# Patient Record
Sex: Female | Born: 1955 | Race: White | Hispanic: No | Marital: Single | Smoking: Current every day smoker
Health system: Southern US, Community
[De-identification: ages and names within clinical notes are randomized; demographics above are authoritative.]

## PROBLEM LIST (undated history)

## (undated) DIAGNOSIS — M1990 Unspecified osteoarthritis, unspecified site: Secondary | ICD-10-CM

## (undated) DIAGNOSIS — G479 Sleep disorder, unspecified: Secondary | ICD-10-CM

## (undated) HISTORY — PX: ROTATOR CUFF REPAIR: SHX139

## (undated) HISTORY — PX: FEMUR SURGERY: SHX943

## (undated) HISTORY — PX: MANDIBLE SURGERY: SHX707

## (undated) HISTORY — PX: TONSILLECTOMY: SUR1361

## (undated) HISTORY — PX: KNEE SURGERY: SHX244

---

## 1998-05-27 ENCOUNTER — Ambulatory Visit (HOSPITAL_COMMUNITY): Admission: RE | Admit: 1998-05-27 | Discharge: 1998-05-27 | Payer: Self-pay | Admitting: Obstetrics and Gynecology

## 1999-06-21 ENCOUNTER — Ambulatory Visit (HOSPITAL_COMMUNITY): Admission: RE | Admit: 1999-06-21 | Discharge: 1999-06-21 | Payer: Self-pay | Admitting: *Deleted

## 1999-06-21 ENCOUNTER — Encounter: Payer: Self-pay | Admitting: *Deleted

## 1999-10-16 ENCOUNTER — Encounter: Admission: RE | Admit: 1999-10-16 | Discharge: 1999-10-16 | Payer: Self-pay | Admitting: *Deleted

## 1999-10-16 ENCOUNTER — Encounter: Payer: Self-pay | Admitting: *Deleted

## 1999-10-23 ENCOUNTER — Ambulatory Visit (HOSPITAL_COMMUNITY): Admission: RE | Admit: 1999-10-23 | Discharge: 1999-10-23 | Payer: Self-pay | Admitting: *Deleted

## 1999-10-23 ENCOUNTER — Encounter: Payer: Self-pay | Admitting: *Deleted

## 2001-02-14 ENCOUNTER — Other Ambulatory Visit: Admission: RE | Admit: 2001-02-14 | Discharge: 2001-02-14 | Payer: Self-pay | Admitting: *Deleted

## 2001-02-26 ENCOUNTER — Encounter: Payer: Self-pay | Admitting: Family Medicine

## 2001-02-26 ENCOUNTER — Encounter: Admission: RE | Admit: 2001-02-26 | Discharge: 2001-02-26 | Payer: Self-pay | Admitting: Family Medicine

## 2002-01-15 ENCOUNTER — Encounter: Admission: RE | Admit: 2002-01-15 | Discharge: 2002-01-15 | Payer: Self-pay | Admitting: Family Medicine

## 2002-01-15 ENCOUNTER — Encounter: Payer: Self-pay | Admitting: Family Medicine

## 2002-02-16 ENCOUNTER — Other Ambulatory Visit: Admission: RE | Admit: 2002-02-16 | Discharge: 2002-02-16 | Payer: Self-pay | Admitting: *Deleted

## 2003-01-19 ENCOUNTER — Ambulatory Visit (HOSPITAL_COMMUNITY): Admission: RE | Admit: 2003-01-19 | Discharge: 2003-01-19 | Payer: Self-pay | Admitting: *Deleted

## 2003-01-19 ENCOUNTER — Encounter: Payer: Self-pay | Admitting: *Deleted

## 2003-02-25 ENCOUNTER — Other Ambulatory Visit: Admission: RE | Admit: 2003-02-25 | Discharge: 2003-02-25 | Payer: Self-pay | Admitting: *Deleted

## 2004-04-05 ENCOUNTER — Other Ambulatory Visit: Admission: RE | Admit: 2004-04-05 | Discharge: 2004-04-05 | Payer: Self-pay | Admitting: *Deleted

## 2005-01-12 ENCOUNTER — Ambulatory Visit (HOSPITAL_COMMUNITY): Admission: RE | Admit: 2005-01-12 | Discharge: 2005-01-12 | Payer: Self-pay | Admitting: *Deleted

## 2005-04-11 ENCOUNTER — Other Ambulatory Visit: Admission: RE | Admit: 2005-04-11 | Discharge: 2005-04-11 | Payer: Self-pay | Admitting: *Deleted

## 2005-07-05 ENCOUNTER — Encounter: Admission: RE | Admit: 2005-07-05 | Discharge: 2005-07-05 | Payer: Self-pay | Admitting: Gastroenterology

## 2007-05-27 ENCOUNTER — Ambulatory Visit (HOSPITAL_COMMUNITY): Admission: RE | Admit: 2007-05-27 | Discharge: 2007-05-27 | Payer: Self-pay | Admitting: Obstetrics and Gynecology

## 2007-06-10 ENCOUNTER — Other Ambulatory Visit: Admission: RE | Admit: 2007-06-10 | Discharge: 2007-06-10 | Payer: Self-pay | Admitting: Obstetrics and Gynecology

## 2007-12-24 ENCOUNTER — Encounter: Admission: RE | Admit: 2007-12-24 | Discharge: 2007-12-24 | Payer: Self-pay | Admitting: Family Medicine

## 2009-08-13 IMAGING — CT CT ABDOMEN W/ CM
2 of 5 series · 17 of 46 positions shown, 19 images · IV contrast (READICAT/WATER & [ID] OMNI 300)
Comparison: 07/05/2005.

CT ABDOMEN

CLINICAL DATA: Mid abdominal pain.  Diarrhea and constipation.

CT ABDOMEN AND PELVIS WITH CONTRAST
TECHNIQUE: Multidetector CT imaging of the abdomen and pelvis was
performed using the standard protocol following bolus
administration of intravenous contrast.
Contrast: 100 ml 3mnipaque-UJJ.

[Series 3: routine abdomen · axial · 0.86mm/px · z∈[-377,+13]mm · 14 of 88 slices shown, 16 images]
[im 5/88  soft-tissue]
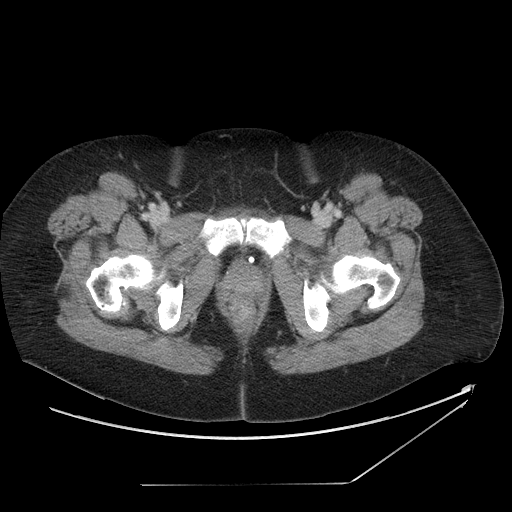
[im 5/88  bone]
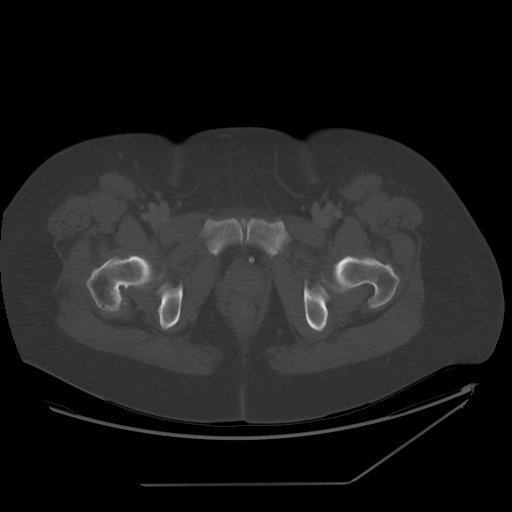
[im 10/88  soft-tissue]
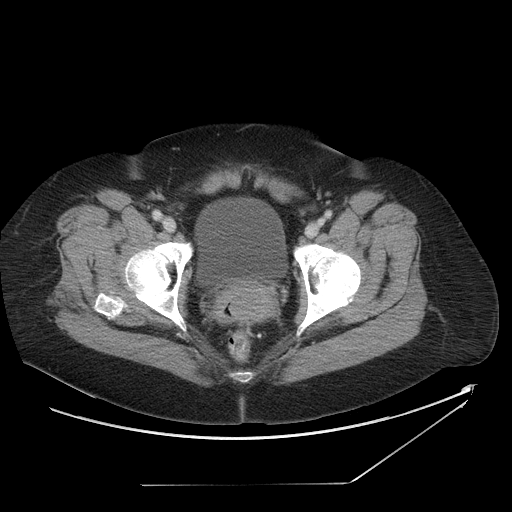
[im 20/88  soft-tissue]
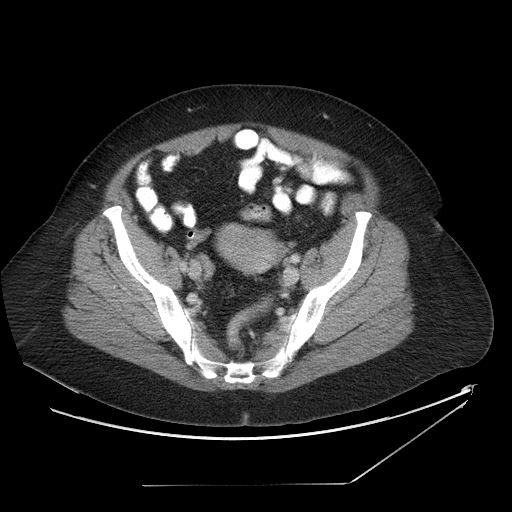
[im 25/88  soft-tissue]
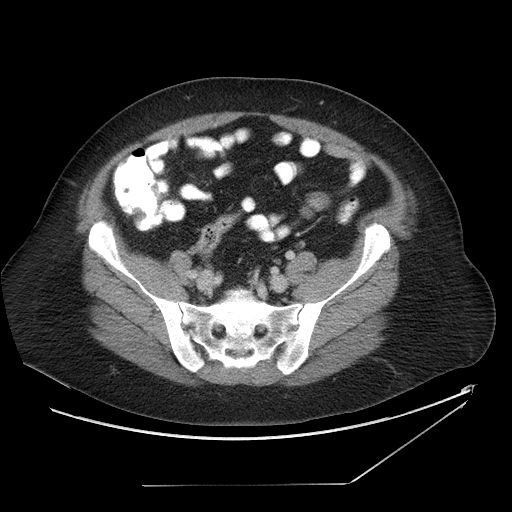
[im 30/88  soft-tissue]
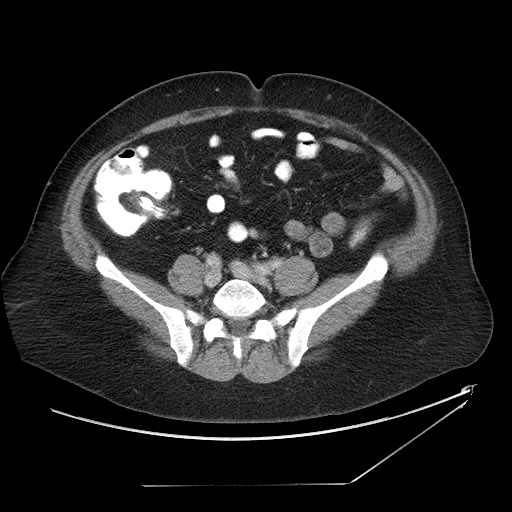
[im 34/88  soft-tissue]
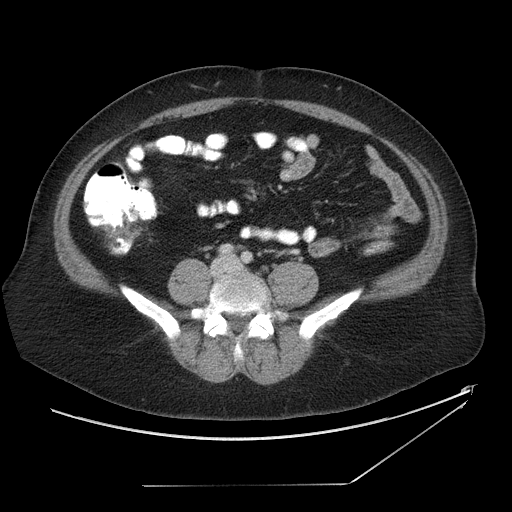
[im 39/88  soft-tissue]
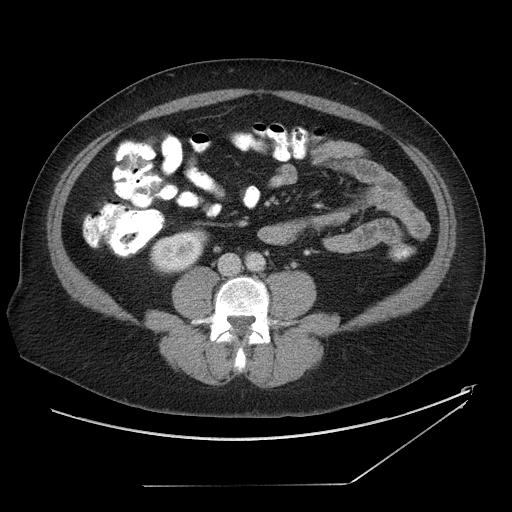
[im 49/88  soft-tissue]
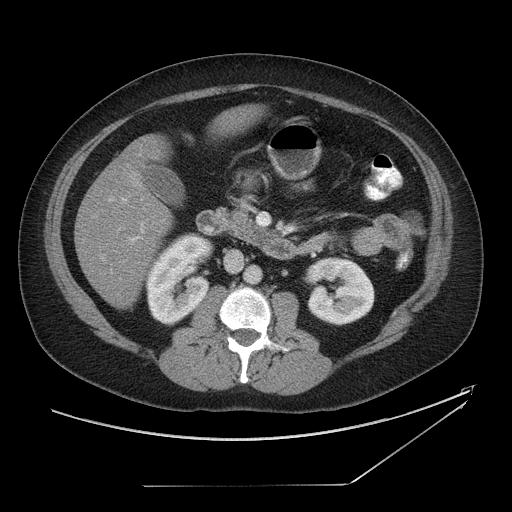
[im 54/88  soft-tissue]
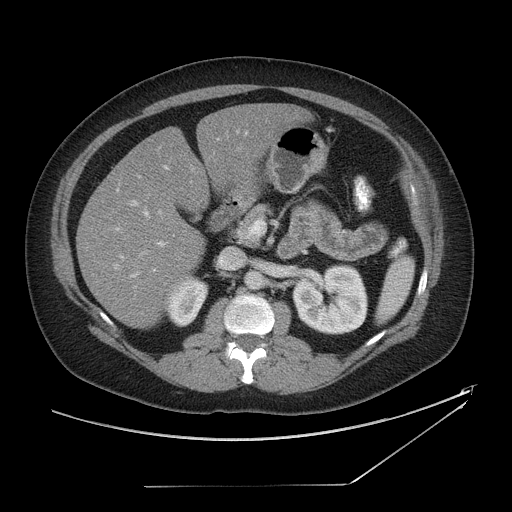
[im 54/88  bone]
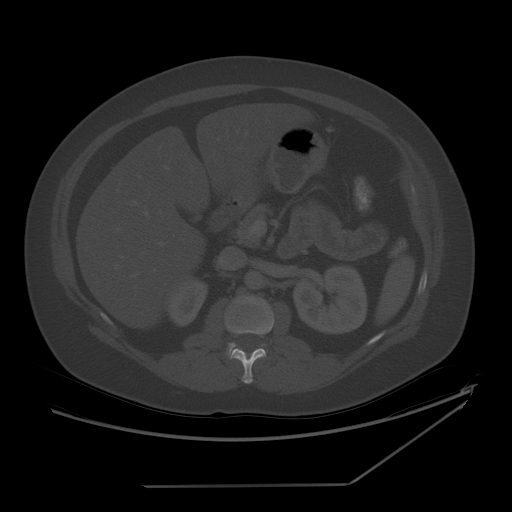
[im 59/88  soft-tissue]
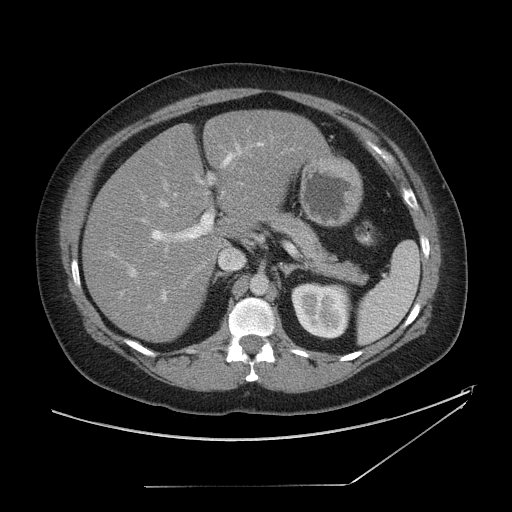
[im 63/88  soft-tissue]
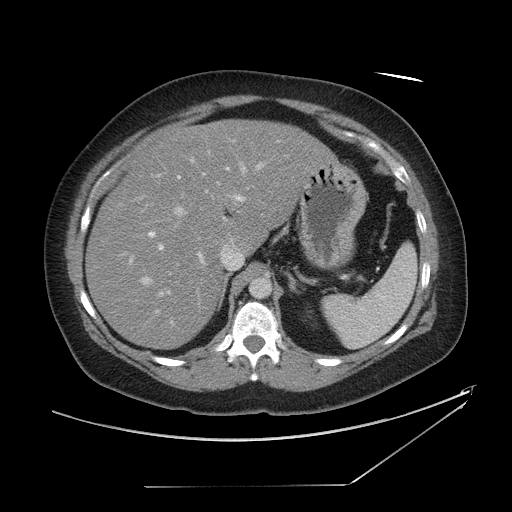
[im 68/88  soft-tissue]
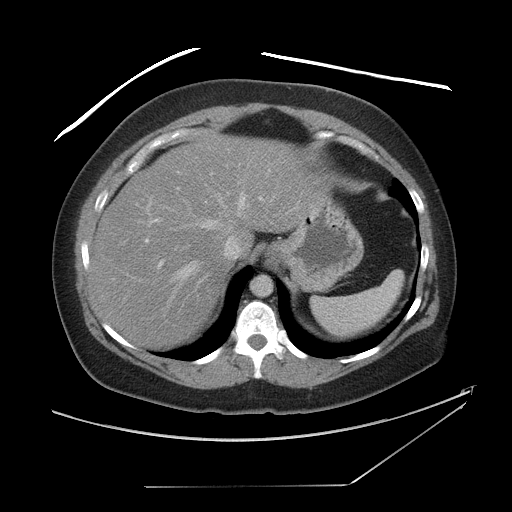
[im 78/88  soft-tissue]
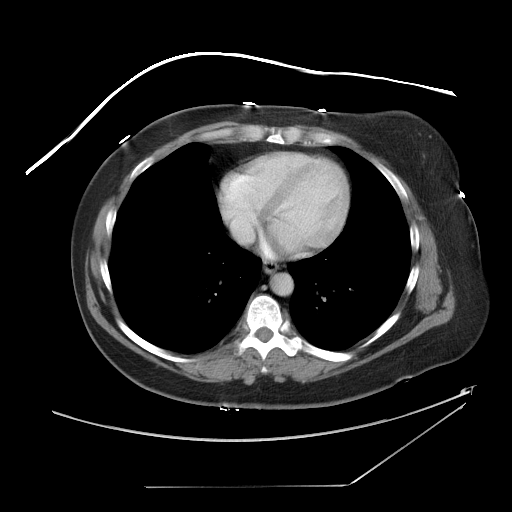
[im 83/88  soft-tissue]
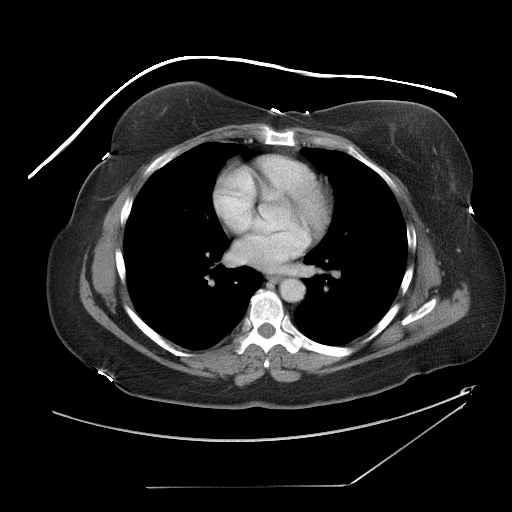

[Series 602: sagittal body · sagittal · 0.91mm/px · 3 of 177 slices shown]
[im 59/177  soft-tissue]
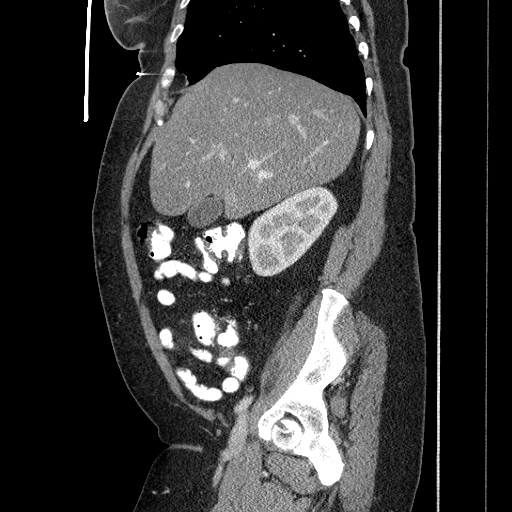
[im 79/177  soft-tissue]
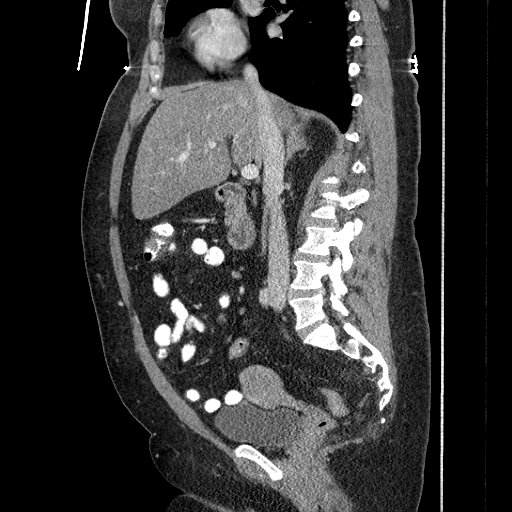
[im 98/177  soft-tissue]
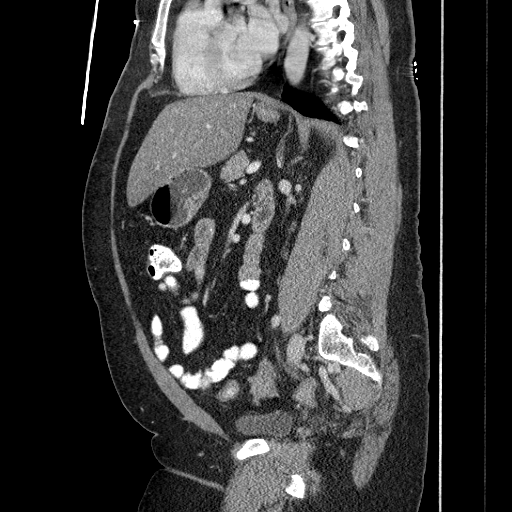

[17 of 46 positions shown; findings below may reference images not displayed]

FINDINGS: Lung bases are clear.  Heart size normal.  No
pericardial or pleural effusion.

Liver appears decreased in attenuation diffusely.  Gallbladder and
adrenal glands unremarkable.  A sub centimeter low attenuation
lesion in the right kidney is likely a tiny cyst.  Kidneys, spleen,
pancreas, stomach and small bowel unremarkable.  No pathologically
enlarged lymph nodes.  No free fluid.
IMPRESSION: 1.  No acute findings.
2.  Fatty liver.

CT PELVIS
FINDINGS: There may be a small fibroid in the uterine fundus.
Ovaries are visualized.  Colon and appendix unremarkable.  No
pathologically enlarged lymph nodes.  No free fluid.  No worrisome
lytic or sclerotic lesions.
IMPRESSION: 1.  No acute findings.

## 2012-01-28 ENCOUNTER — Emergency Department
Admission: EM | Admit: 2012-01-28 | Discharge: 2012-01-28 | Disposition: A | Discharge status: Home / Self Care | Payer: Self-pay | Source: Home / Self Care | Attending: Family Medicine | Admitting: Family Medicine

## 2012-01-28 ENCOUNTER — Emergency Department: Admit: 2012-01-28 | Discharge: 2012-01-28 | Disposition: A | Discharge status: Home / Self Care | Payer: Self-pay

## 2012-01-28 ENCOUNTER — Encounter: Payer: Self-pay | Admitting: *Deleted

## 2012-01-28 DIAGNOSIS — S93609A Unspecified sprain of unspecified foot, initial encounter: Secondary | ICD-10-CM

## 2012-01-28 DIAGNOSIS — S93409A Sprain of unspecified ligament of unspecified ankle, initial encounter: Secondary | ICD-10-CM

## 2012-01-28 DIAGNOSIS — S93401A Sprain of unspecified ligament of right ankle, initial encounter: Secondary | ICD-10-CM

## 2012-01-28 DIAGNOSIS — S93601A Unspecified sprain of right foot, initial encounter: Secondary | ICD-10-CM

## 2012-01-28 HISTORY — DX: Unspecified osteoarthritis, unspecified site: M19.90

## 2012-01-28 HISTORY — DX: Sleep disorder, unspecified: G47.9

## 2012-01-28 MED ORDER — KETOROLAC TROMETHAMINE 60 MG/2ML IM SOLN
60.0000 mg | Freq: Once | INTRAMUSCULAR | Status: AC
  Administered 2012-01-28: 60 mg via INTRAMUSCULAR

## 2012-01-28 NOTE — ED Provider Notes (Signed)
History     CSN: [id number]  Arrival date & time 01/28/12  1514   First MD Initiated Contact with Patient 01/28/12 1532      Chief Complaint  Patient presents with  . Foot Injury      HPI Comments: Patient fell today on a slippery floor about 6 hours ago, injuring her right ankle and foot.  She has pain with ambulation.  Patient is a 56 y.o. female presenting with ankle pain. The history is provided by the patient.  Ankle Pain This is a new problem. The current episode started 6 to 12 hours ago. The problem occurs constantly. The problem has been gradually worsening. Associated symptoms comments: none. The symptoms are aggravated by walking and standing. The symptoms are relieved by nothing. Treatments tried: pain medication. The treatment provided mild relief.    Past Medical History  Diagnosis Date  . Sleep disorder   . Arthritis     Past Surgical History  Procedure Date  . Tonsillectomy   . Cesarean section   . Mandible surgery   . Femur surgery   . Knee surgery   . Rotator cuff repair     Family History  Problem Relation Age of Onset  . GER disease Mother   . Emphysema Father     History  Substance Use Topics  . Smoking status: Current Everyday Smoker -- 0.5 packs/day  . Smokeless tobacco: Not on file  . Alcohol Use: No    OB History    Grav Para Term Preterm Abortions TAB SAB Ect Mult Living                  Review of Systems  All other systems reviewed and are negative.    Allergies  Review of patient's allergies indicates no known allergies.  Home Medications   Current Outpatient Rx  Name Route Sig Dispense Refill  . HYDROCODONE-ACETAMINOPHEN 5-500 MG PO TABS Oral Take 1 tablet by mouth every 6 (six) hours as needed.    Marland Kitchen LORAZEPAM 1 MG PO TABS Oral Take 1 mg by mouth every 8 (eight) hours.      BP 129/84  Pulse 94  Temp(Src) 98.1 F (36.7 C) (Oral)  Resp 18  Ht 5\' 1"  (1.549 m)  Wt 200 lb (90.719 kg)  BMI 37.79 kg/m2  SpO2  96%  Physical Exam  Constitutional: She is oriented to person, place, and time. She appears well-developed and well-nourished. No distress.       Patient is obese (BMI 37.9)  Eyes: Conjunctivae are normal. Pupils are equal, round, and reactive to light.  Musculoskeletal: She exhibits tenderness.       Right ankle: She exhibits decreased range of motion and swelling. She exhibits no ecchymosis, no deformity, no laceration and normal pulse. tenderness. Lateral malleolus, medial malleolus, AITFL and CF ligament tenderness found. No posterior TFL, no head of 5th metatarsal and no proximal fibula tenderness found. Achilles tendon normal.       Right foot: She exhibits decreased range of motion, tenderness and bony tenderness. She exhibits no swelling, normal capillary refill, no crepitus, no deformity and no laceration.       Feet:       Tenderness noted on diagrams in red.  No swelling.  Distal Neurovascular function is intact.   Neurological: She is alert and oriented to person, place, and time.  Skin: Skin is warm and dry.    ED Course  Procedures none  Labs Reviewed - No data  to display Dg Ankle Complete Right  01/29/2012  *RADIOLOGY REPORT*  Clinical Data: Twisting ankle injury.  Tenderness.  RIGHT ANKLE - COMPLETE 3+ VIEW  Comparison: None.  Findings: Soft tissue swelling overlying the lateral malleolus noted.  No malleolar fractures noted.  The plafond and talar dome appear intact.  Plantar calcaneal spur noted.  Dorsal talonavicular spurring is present, potentially with a chronically fragmented osteophyte along the dorsal portion of the navicular. Talonavicular joint effusion is suspected.  Suspected os peroneus noted.  IMPRESSION:  1.  Soft tissue swelling overlying the lateral malleolus, without underlying fracture visualized. 2.  Plantar calcaneal spur. 3.  Possible dorsal effusion of the talonavicular joint, without definite fracture.  Original Report Authenticated By: Dellia Cloud, M.D.   Dg Foot Complete Right  01/29/2012  *RADIOLOGY REPORT*  Clinical Data: Fall.  Foot and ankle injury.  RIGHT FOOT COMPLETE - 3+ VIEW  Comparison: None.  Findings: Plantar calcaneal spur noted.  Alignment at the Lisfranc joint appears normal.  Mild dorsal soft tissue swelling noted along the foot, without underlying acute osseous abnormality observed.  Thickened distal metaphysis of the proximal phalanx of the small toe favors an old fracture.  IMPRESSION:  1.  Suspected old fracture of the proximal phalanx of the small toe. 2.  Dorsal soft tissue swelling along the foot, without acute bony findings observed. 3.  Plantar calcaneal spur.  Original Report Authenticated By: Dellia Cloud, M.D.     1. Right ankle sprain   2. Right foot sprain       MDM  Ace wrap applied.  Patient declines crutches (she states that she has a walker at home) Applied AirCast splint. Apply ice pack for 30 minutes every 1 to 2 hours today and tomorrow.  Elevate.  Use crutches (or walker) for 3 to 5 days.  Wear Ace wrap until swelling decreases.  Wear brace for about 2 to 3 weeks.  Begin range of motion and stretching exercises in about 5 days as per instruction sheet ([facility] information and instruction handout given)  Take Ibuprofen 200mg , 4 tabs every 8 hours with food.  May continue present APAP/hydrocodone as needed. Followup with [facility] if not improving about two weeks.         Lattie Haw, MD 01/29/12 [phone]

## 2012-01-28 NOTE — Discharge Instructions (Signed)
Apply ice pack for 30 minutes every 1 to 2 hours today and tomorrow.  Elevate.  Use crutches (or walker) for 3 to 5 days.  Wear Ace wrap until swelling decreases.  Wear brace for about 2 to 3 weeks.  Begin range of motion and stretching exercises in about 5 days as per instruction sheet.  Take Ibuprofen 200mg , 4 tabs every 8 hours with food.  May continue present APAP/hydrocodone as needed.

## 2012-01-28 NOTE — ED Notes (Signed)
Pt states that she slipped on some water, on the floor at her work injuring her RT foot/ankle this morning. She states that she would not like to file workers comp. She took 2 hydrocodone at 1200.

## 2020-03-02 DIAGNOSIS — F4321 Adjustment disorder with depressed mood: Secondary | ICD-10-CM | POA: Diagnosis not present

## 2020-03-02 DIAGNOSIS — F5102 Adjustment insomnia: Secondary | ICD-10-CM | POA: Diagnosis not present

## 2020-03-02 DIAGNOSIS — M791 Myalgia, unspecified site: Secondary | ICD-10-CM | POA: Diagnosis not present

## 2020-03-02 DIAGNOSIS — F4322 Adjustment disorder with anxiety: Secondary | ICD-10-CM | POA: Diagnosis not present

## 2020-07-29 DIAGNOSIS — R531 Weakness: Secondary | ICD-10-CM | POA: Diagnosis not present

## 2020-07-29 DIAGNOSIS — I639 Cerebral infarction, unspecified: Secondary | ICD-10-CM | POA: Diagnosis not present

## 2020-07-29 DIAGNOSIS — I498 Other specified cardiac arrhythmias: Secondary | ICD-10-CM | POA: Diagnosis not present

## 2020-07-29 DIAGNOSIS — Z7901 Long term (current) use of anticoagulants: Secondary | ICD-10-CM | POA: Diagnosis not present

## 2020-07-29 DIAGNOSIS — E785 Hyperlipidemia, unspecified: Secondary | ICD-10-CM | POA: Diagnosis not present

## 2020-07-29 DIAGNOSIS — Z794 Long term (current) use of insulin: Secondary | ICD-10-CM | POA: Diagnosis not present

## 2020-07-29 DIAGNOSIS — F17200 Nicotine dependence, unspecified, uncomplicated: Secondary | ICD-10-CM | POA: Diagnosis not present

## 2020-07-29 DIAGNOSIS — M1990 Unspecified osteoarthritis, unspecified site: Secondary | ICD-10-CM | POA: Diagnosis not present

## 2020-07-29 DIAGNOSIS — I6389 Other cerebral infarction: Secondary | ICD-10-CM | POA: Diagnosis not present

## 2020-07-29 DIAGNOSIS — R200 Anesthesia of skin: Secondary | ICD-10-CM | POA: Diagnosis not present

## 2020-07-29 DIAGNOSIS — R29701 NIHSS score 1: Secondary | ICD-10-CM | POA: Diagnosis not present

## 2020-07-29 DIAGNOSIS — E119 Type 2 diabetes mellitus without complications: Secondary | ICD-10-CM | POA: Diagnosis not present

## 2020-07-29 DIAGNOSIS — Z7982 Long term (current) use of aspirin: Secondary | ICD-10-CM | POA: Diagnosis not present

## 2020-07-29 DIAGNOSIS — F329 Major depressive disorder, single episode, unspecified: Secondary | ICD-10-CM | POA: Diagnosis not present

## 2020-07-29 DIAGNOSIS — R0902 Hypoxemia: Secondary | ICD-10-CM | POA: Diagnosis not present

## 2020-07-29 DIAGNOSIS — I10 Essential (primary) hypertension: Secondary | ICD-10-CM | POA: Diagnosis not present

## 2020-07-29 DIAGNOSIS — I6381 Other cerebral infarction due to occlusion or stenosis of small artery: Secondary | ICD-10-CM | POA: Diagnosis not present

## 2020-07-29 DIAGNOSIS — G459 Transient cerebral ischemic attack, unspecified: Secondary | ICD-10-CM | POA: Diagnosis not present

## 2020-07-29 DIAGNOSIS — G8194 Hemiplegia, unspecified affecting left nondominant side: Secondary | ICD-10-CM | POA: Diagnosis not present

## 2020-07-29 DIAGNOSIS — R9431 Abnormal electrocardiogram [ECG] [EKG]: Secondary | ICD-10-CM | POA: Diagnosis not present

## 2020-08-01 DIAGNOSIS — I639 Cerebral infarction, unspecified: Secondary | ICD-10-CM | POA: Diagnosis not present

## 2020-08-01 DIAGNOSIS — Z720 Tobacco use: Secondary | ICD-10-CM | POA: Diagnosis not present

## 2020-08-01 DIAGNOSIS — I10 Essential (primary) hypertension: Secondary | ICD-10-CM | POA: Diagnosis not present

## 2020-08-01 DIAGNOSIS — E785 Hyperlipidemia, unspecified: Secondary | ICD-10-CM | POA: Diagnosis not present

## 2020-08-01 DIAGNOSIS — R29898 Other symptoms and signs involving the musculoskeletal system: Secondary | ICD-10-CM | POA: Diagnosis not present

## 2020-08-18 DIAGNOSIS — R200 Anesthesia of skin: Secondary | ICD-10-CM | POA: Diagnosis not present

## 2020-08-18 DIAGNOSIS — I69354 Hemiplegia and hemiparesis following cerebral infarction affecting left non-dominant side: Secondary | ICD-10-CM | POA: Diagnosis not present

## 2020-08-18 DIAGNOSIS — R202 Paresthesia of skin: Secondary | ICD-10-CM | POA: Diagnosis not present

## 2020-08-18 DIAGNOSIS — Z7901 Long term (current) use of anticoagulants: Secondary | ICD-10-CM | POA: Diagnosis not present

## 2020-08-23 DIAGNOSIS — I10 Essential (primary) hypertension: Secondary | ICD-10-CM | POA: Diagnosis not present

## 2020-09-07 ENCOUNTER — Ambulatory Visit: Payer: Self-pay | Admitting: Rehabilitative and Restorative Service Providers"

## 2020-09-14 ENCOUNTER — Other Ambulatory Visit: Payer: Self-pay

## 2020-09-14 ENCOUNTER — Ambulatory Visit (INDEPENDENT_AMBULATORY_CARE_PROVIDER_SITE_OTHER): Payer: PPO | Admitting: Rehabilitative and Restorative Service Providers"

## 2020-09-14 VITALS — BP 156/92 | HR 81

## 2020-09-14 DIAGNOSIS — R29818 Other symptoms and signs involving the nervous system: Secondary | ICD-10-CM | POA: Diagnosis not present

## 2020-09-14 DIAGNOSIS — R278 Other lack of coordination: Secondary | ICD-10-CM | POA: Diagnosis not present

## 2020-09-14 DIAGNOSIS — M6281 Muscle weakness (generalized): Secondary | ICD-10-CM | POA: Diagnosis not present

## 2020-09-14 NOTE — Patient Instructions (Signed)
Access Code: [email or web address] URL: [email or web address] Date: 09/14/2020 Prepared by: Margretta Ditty  Program Notes WRITING:  Begin with capital letters (print), and write the alphabet A to Z 2-3 times per day for dexterity and coordination.   Exercises Seated Wrist Extension with Dumbbell - 2 x daily - 7 x weekly - 1 sets - 10 reps Wrist Flexion with Dumbbell - 2 x daily - 7 x weekly - 1 sets - 10 reps Hand Strengthening and Coordination - 2 x daily - 7 x weekly - 1 sets - 10 reps Wall Push Up - 2 x daily - 7 x weekly - 1 sets - 10 reps Single Leg Stance with Support - 2 x daily - 7 x weekly - 1 sets - 3 reps - 10 seconds hold

## 2020-09-14 NOTE — Therapy (Addendum)
[facility] [place] [address] [place] [address] [place], [place], [address] Phone: [phone]   Fax:  [phone]  Physical Therapy Evaluation  Patient Details  Name: Stacie Martin MRN: [id number] Date of Birth: 07/21/1956 Referring Provider (PT): Adelene Amas, DO   Encounter Date: 09/14/2020   PT End of Session - 09/14/20 1152    Visit Number 1    Number of Visits 8    Date for PT Re-Evaluation 11/13/20    Authorization Type medicare    PT Start Time 1103    PT Stop Time 1148    PT Time Calculation (min) 45 min           Past Medical History:  Diagnosis Date  . Arthritis   . Sleep disorder     Past Surgical History:  Procedure Laterality Date  . CESAREAN SECTION    . FEMUR SURGERY    . KNEE SURGERY    . MANDIBLE SURGERY    . ROTATOR CUFF REPAIR    . TONSILLECTOMY      Vitals:   09/14/20 1133  BP: (!) 156/92  Pulse: 81      Subjective Assessment - 09/14/20 1105    Subjective The patient reports that she had a stroke on 07/29/2020.  She was admitted to [facility] x 2 days after BP stabilized.  Her BP has been running high.  She is being seen by cardiology this afternoon for a monitor.  Her cc: numbness t/o the L side of her face/body.  She has tightness in L trunk musculature, dec'd use of the L UE for daily activities.    Pertinent History CVA 07/2020, HTN, spasms (in her neck and back)-- takes meds for h/o spasms, h/o arthritis    Patient Stated Goals be able to return to writing, reduce numbness and control my hand again    Currently in Pain? No/denies    Pain Descriptors / Indicators Numbness;Tingling    Pain Type Acute pain    Pain Onset More than a month ago    Pain Frequency Intermittent              [facility] PT Assessment - 09/14/20 1116      Assessment   Medical Diagnosis R thalamic stroke    Referring Provider (PT) Adelene Amas, DO    Onset Date/Surgical Date 07/29/20    Hand Dominance Left    Prior  Therapy None      Precautions   Precautions Fall    Precaution Comments decreased sensation      Restrictions   Weight Bearing Restrictions No      Balance Screen   Has the patient fallen in the past 6 months Yes    How many times? On ice, last week; has increased pain in L shoulder since that fall    Has the patient had a decrease in activity level because of a fear of falling?  Yes   due to recent stroke   Is the patient reluctant to leave their home because of a fear of falling?  No      Home Environment   Living Environment Private residence    Living Arrangements Parent   66 year old mother lives with her     Prior Function   Level of Independence Independent    Leisure Currently unable to stir, do makeup, hold her cigarette in the L hand      Sensation   Light Touch Impaired Detail  Additional Comments L hand feels numb, but worse part is in palm; band of numbness in proximal arm; L thigh and side numbness      Coordination   Fine Motor Movements are Fluid and Coordinated Yes    Finger Nose Finger Test dec'd on L side; has mild tremor bilaterally with FTN      ROM / Strength   AROM / PROM / Strength AROM;Strength      AROM   Overall AROM  Within functional limits for tasks performed      Strength   Strength Assessment Site Shoulder;Elbow;Wrist;Hand    Right/Left Shoulder Right;Left    Right Shoulder Flexion 4/5    Right Shoulder ABduction 4/5    Left Shoulder Flexion 4-/5    Left Shoulder ABduction 4-/5    Right/Left Elbow Right;Left    Right Elbow Flexion 5/5    Right Elbow Extension 5/5    Left Elbow Flexion 4/5    Left Elbow Extension 4/5    Right/Left Wrist Right;Left    Right Wrist Flexion 5/5    Right Wrist Extension 5/5    Left Wrist Flexion 4/5    Left Wrist Extension 4/5    Right/Left hand Right;Left    Right Hand Grip (lbs) 50    Right Hand Lateral Pinch 8 lbs    Left Hand Grip (lbs) 45    Left Hand Lateral Pinch 8 lbs      Special Tests    Other special tests Moving 5 marbles from table to a cup R hand x 5.77 seconds and L hand 9.92 seconds      Ambulation/Gait   Ambulation/Gait Yes    Ambulation/Gait Assistance 7: Independent    Gait Comments slowed pace with mild motor timing issues evident through observation      High Level Balance   High Level Balance Comments SLS 3 seconds bilaterally                      Objective measurements completed on examination: See above findings.               PT Education - 09/14/20 1151    Education Details HEP initiated    [employer]) Educated Patient    Methods Explanation;Demonstration;Handout    Comprehension Verbalized understanding;Returned demonstration            PT Short Term Goals - 09/14/20 1153      PT SHORT TERM GOAL #1   Title The patient will be indep with HEP for coordination, strength, and balance.    Time 4    Period Weeks    Target Date 10/14/20      PT SHORT TERM GOAL #2   Title The patient will improve single leg stance to 5 seconds bilaterally.    Time 4    Period Weeks    Target Date 10/14/20      PT SHORT TERM GOAL #3   Title The patient will demonstrate writing the alphabet reporting 75% return to baseline.    Time 4    Period Weeks    Target Date 10/14/20      PT SHORT TERM GOAL #4   Title The patient will report being able to stir with L UE for meal preparation.    Time 4    Period Weeks    Target Date 10/14/20             PT Long Term Goals - 09/14/20 1155  PT LONG TERM GOAL #1   Title The patient will be independent with progression of HEP.    Time 8    Period Weeks    Target Date 11/13/20      PT LONG TERM GOAL #2   Title The patient will improve grip strength L hand to > or equal to 55 lbs (45 at baseline and R hand is 50 lbs- she is L dominant).    Time 8    Period Weeks    Target Date 11/13/20      PT LONG TERM GOAL #3   Title The patient will verbalize ability to put makeup on with L  UE.    Time 8    Period Weeks    Target Date 11/13/20      PT LONG TERM GOAL #4   Title The patient will report dec'd sensation of pain in L trunk musculature.    Time 8    Period Weeks    Target Date 11/13/20                  Plan - 09/14/20 1157    Clinical Impression Statement The patient is a 65 yo female presenting to OP physical therapy s/p R thalamic CVA 07/29/20.  She presents with impairments in L sided sensation, coordination, strength, motor timing leading to diminished ability to perform ADLs, IADLs.  PT to address deficits to promote return to prior functional status.    Personal Factors and Comorbidities Comorbidity 1    Comorbidities HTN    Examination-Activity Limitations Reach Overhead;Caring for Others;Self Feeding;Lift;Carry    Examination-Participation Restrictions Cleaning;Meal Prep;Driving    Stability/Clinical Decision Making Stable/Uncomplicated    Clinical Decision Making Low    Rehab Potential Good    PT Frequency 1x / week    PT Duration 8 weeks    PT Treatment/Interventions ADLs/Self Care Home Management;Patient/family education;Taping;Neuromuscular re-education;Balance training;Therapeutic exercise;Therapeutic activities;Functional mobility training;Gait training;Manual techniques    PT Next Visit Plan Work on functional tasks to replicate coodination with stirring and meal prep, work on loading UEs for functional strength, fine motor and writing tasks.  Progress balance working on tandem and SLs    PT Home Exercise Plan Access Code: [email or web address]    Consulted and Agree with Plan of Care Patient           Patient will benefit from skilled therapeutic intervention in order to improve the following deficits and impairments:  Decreased strength,Impaired sensation,Decreased balance,Impaired UE functional use,Decreased coordination  Visit Diagnosis: Muscle weakness (generalized) - Plan: PT plan of care cert/re-cert  Other lack of coordination - Plan:  PT plan of care cert/re-cert  Other symptoms and signs involving the nervous system - Plan: PT plan of care cert/re-cert     Problem List There are no problems to display for this patient.   Stacie Martin, PT 09/14/2020, 12:04 PM  [facility] [address] [place] [address] [place], [place], [address] Phone: [phone]   Fax:  [phone]  Name: Stacie Martin MRN: [id number] Date of Birth: [birth year]

## 2020-09-21 ENCOUNTER — Encounter: Payer: Medicare Other | Admitting: Rehabilitative and Restorative Service Providers"

## 2020-09-28 ENCOUNTER — Encounter: Payer: Self-pay | Admitting: Physical Therapy

## 2020-09-28 ENCOUNTER — Other Ambulatory Visit: Payer: Self-pay

## 2020-09-28 ENCOUNTER — Ambulatory Visit: Payer: PPO | Admitting: Physical Therapy

## 2020-09-28 DIAGNOSIS — M6281 Muscle weakness (generalized): Secondary | ICD-10-CM

## 2020-09-28 DIAGNOSIS — R278 Other lack of coordination: Secondary | ICD-10-CM

## 2020-09-28 DIAGNOSIS — R29818 Other symptoms and signs involving the nervous system: Secondary | ICD-10-CM | POA: Diagnosis not present

## 2020-09-28 NOTE — Therapy (Signed)
[facility] [place] [address] [place], [place], [address] Phone: [phone]   Fax:  [phone]  Physical Therapy Treatment  Patient Details  Name: Stacie Martin MRN: [id number] Date of Birth: Jan 26, 1956 Referring Provider (PT): Adelene Amas, DO   Encounter Date: 09/28/2020   PT End of Session - 09/28/20 1147    Visit Number 2    Number of Visits 8    Date for PT Re-Evaluation 11/13/20    PT Start Time 1100    PT Stop Time 1143    PT Time Calculation (min) 43 min    Activity Tolerance Patient tolerated treatment well    Behavior During Therapy [facility] for tasks assessed/performed           Past Medical History:  Diagnosis Date  . Arthritis   . Sleep disorder     Past Surgical History:  Procedure Laterality Date  . CESAREAN SECTION    . FEMUR SURGERY    . KNEE SURGERY    . MANDIBLE SURGERY    . ROTATOR CUFF REPAIR    . TONSILLECTOMY      There were no vitals filed for this visit.   Subjective Assessment - 09/28/20 1103    Subjective Pt reports she feels her strength is "OK". she thinks her numbness is causing most of her problems    Patient Stated Goals be able to return to writing, reduce numbness and control my hand again    Currently in Pain? No/denies                             [facility] Adult PT Treatment/Exercise - 09/28/20 0001      Exercises   Exercises Wrist;Hand;Shoulder      Shoulder Exercises: Prone   Horizontal ABduction 2 Limitations quadruped alt UE lift x 10 Rt UE to focus on Lt LE wt bearing. quadruped alt LE lift x 10 bilat      Shoulder Exercises: Standing   Extension Left;20 reps;Theraband    Theraband Level (Shoulder Extension) Level 2 (Red)    Diagonals Strengthening;Left;20 reps;Theraband    Theraband Level (Shoulder Diagonals) Level 2 (Red)      Hand Exercises   Large Pegboard pegboard out/in 2 rows with LT hand only    Fine Motor Coordination --   handwriting and  tracing with LT UE   Other Hand Exercises velcro key grip x 2, paddle grip x 2    Other Hand Exercises marble pick up      Wrist Exercises   Other wrist exercises UBE for warm up level 1 x 4 min alt fwd/bkwd                  PT Education - 09/28/20 1146    Education Details Adding tracing and handwriting to HEP    Person(s) Educated Patient    Methods Explanation;Demonstration    Comprehension Verbalized understanding;Returned demonstration            PT Short Term Goals - 09/14/20 1153      PT SHORT TERM GOAL #1   Title The patient will be indep with HEP for coordination, strength, and balance.    Time 4    Period Weeks    Target Date 10/14/20      PT SHORT TERM GOAL #2   Title The patient will improve single leg stance to 5 seconds bilaterally.    Time 4  Period Weeks    Target Date 10/14/20      PT SHORT TERM GOAL #3   Title The patient will demonstrate writing the alphabet reporting 75% return to baseline.    Time 4    Period Weeks    Target Date 10/14/20      PT SHORT TERM GOAL #4   Title The patient will report being able to stir with L UE for meal preparation.    Time 4    Period Weeks    Target Date 10/14/20             PT Long Term Goals - 09/14/20 1155      PT LONG TERM GOAL #1   Title The patient will be independent with progression of HEP.    Time 8    Period Weeks    Target Date 11/13/20      PT LONG TERM GOAL #2   Title The patient will improve grip strength L hand to > or equal to 55 lbs (45 at baseline and R hand is 50 lbs- she is L dominant).    Time 8    Period Weeks    Target Date 11/13/20      PT LONG TERM GOAL #3   Title The patient will verbalize ability to put makeup on with L UE.    Time 8    Period Weeks    Target Date 11/13/20      PT LONG TERM GOAL #4   Title The patient will report dec'd sensation of pain in L trunk musculature.    Time 8    Period Weeks    Target Date 11/13/20                  Plan - 09/28/20 1147    Clinical Impression Statement Pt with improved coordination and ability to wt bear this session. Added handwriting to improve fine motor coordination and quadruped to improve UE strength    PT Next Visit Plan progress HEP as pt plans to spend time at her son's with her new grandson    PT Home Exercise Plan Access Code: [email or web address]    Consulted and Agree with Plan of Care Patient           Patient will benefit from skilled therapeutic intervention in order to improve the following deficits and impairments:     Visit Diagnosis: Muscle weakness (generalized)  Other lack of coordination  Other symptoms and signs involving the nervous system     Problem List There are no problems to display for this patient.  Reggy Eye, PT  [facility] 09/28/2020, 12:36 PM  [facility] [address] [place] [address] [place], [place], [address] Phone: [phone]   Fax:  [phone]  Name: Stacie Martin MRN: [id number] Date of Birth: 17-Apr-1956

## 2020-10-04 DIAGNOSIS — I639 Cerebral infarction, unspecified: Secondary | ICD-10-CM | POA: Diagnosis not present

## 2020-10-05 ENCOUNTER — Other Ambulatory Visit: Payer: Self-pay

## 2020-10-05 ENCOUNTER — Ambulatory Visit (INDEPENDENT_AMBULATORY_CARE_PROVIDER_SITE_OTHER): Payer: PPO | Admitting: Physical Therapy

## 2020-10-05 VITALS — BP 129/85 | HR 77

## 2020-10-05 DIAGNOSIS — R278 Other lack of coordination: Secondary | ICD-10-CM

## 2020-10-05 DIAGNOSIS — M6281 Muscle weakness (generalized): Secondary | ICD-10-CM | POA: Diagnosis not present

## 2020-10-05 DIAGNOSIS — R29818 Other symptoms and signs involving the nervous system: Secondary | ICD-10-CM | POA: Diagnosis not present

## 2020-10-05 NOTE — Patient Instructions (Signed)
Access Code: [email or web address] URL: [email or web address] Date: 10/05/2020 Prepared by: Reggy Eye  Program Notes WRITING:  Begin with capital letters (print), and write the alphabet A to Z 2-3 times per day for dexterity and coordination.   Exercises Seated Wrist Extension with Dumbbell - 2 x daily - 7 x weekly - 1 sets - 10 reps Wrist Flexion with Dumbbell - 2 x daily - 7 x weekly - 1 sets - 10 reps Hand Strengthening and Coordination - 2 x daily - 7 x weekly - 1 sets - 10 reps Picking Up Coins - 1 x daily - 7 x weekly - 3 sets - 10 reps Quadruped Alternating Leg Extensions - 1 x daily - 7 x weekly - 3 sets - 10 reps Quadruped Alternating Arm Lift - 1 x daily - 7 x weekly - 3 sets - 10 reps Shoulder extension with resistance - Neutral - 1 x daily - 7 x weekly - 3 sets - 10 reps Standing Shoulder Diagonal Horizontal Abduction 60/120 Degrees with Resistance - 1 x daily - 7 x weekly - 3 sets - 10 reps

## 2020-10-05 NOTE — Therapy (Addendum)
[facility] [place], [place], [address] Phone: [phone]   Fax:  [phone]  Physical Therapy Treatment and Discharge  Patient Details  Name: Stacie Martin MRN: [id number] Date of Birth: July 17, 1956 Referring Provider (PT): Irene Pap, DO   Encounter Date: 10/05/2020   PT End of Session - 10/05/20 1137    Visit Number 3    Number of Visits 8    Date for PT Re-Evaluation 11/13/20    PT Start Time 1100    PT Stop Time 1135    PT Time Calculation (min) 35 min    Activity Tolerance Patient tolerated treatment well    Behavior During Therapy [facility] for tasks assessed/performed           Past Medical History:  Diagnosis Date  . Arthritis   . Sleep disorder     Past Surgical History:  Procedure Laterality Date  . CESAREAN SECTION    . FEMUR SURGERY    . KNEE SURGERY    . MANDIBLE SURGERY    . ROTATOR CUFF REPAIR    . TONSILLECTOMY      Vitals:   10/05/20 1103  BP: 129/85  Pulse: 77     Subjective Assessment - 10/05/20 1100    Subjective pt states she has had some high BP readings and MD increased her medication. States she has been feeling dizzy due to change in meds    Patient Stated Goals be able to return to writing, reduce numbness and control my hand again                             [facility] Adult PT Treatment/Exercise - 10/05/20 0001      Shoulder Exercises: Prone   Horizontal ABduction 2 Limitations quadruped alt UE lift with facilitation for LT elbow strength. Alt LE lift focus on UE wt bearing      Shoulder Exercises: Standing   Extension Strengthening;Both;20 reps    Theraband Level (Shoulder Extension) Level 2 (Red)    Diagonals Strengthening;Both;10 reps    Theraband Level (Shoulder Diagonals) Level 2 (Red)      Shoulder Exercises: ROM/Strengthening   UBE (Upper Arm Bike) L1 x 4 min alt fwd/bkwd for warm up      Hand Exercises   Other Hand Exercises velcro  key grip and paddle grip x 2 each    Other Hand Exercises coin pick up                  PT Education - 10/05/20 1135    Education Details updated HEP    Person(s) Educated Patient    Methods Explanation;Demonstration;Handout    Comprehension Verbalized understanding;Returned demonstration            PT Short Term Goals - 09/14/20 1153      PT SHORT TERM GOAL #1   Title The patient will be indep with HEP for coordination, strength, and balance.    Time 4    Period Weeks    Target Date 10/14/20      PT SHORT TERM GOAL #2   Title The patient will improve single leg stance to 5 seconds bilaterally.    Time 4    Period Weeks    Target Date 10/14/20      PT SHORT TERM GOAL #3   Title The patient will demonstrate writing the alphabet reporting 75% return to baseline.    Time  4    Period Weeks    Target Date 10/14/20      PT SHORT TERM GOAL #4   Title The patient will report being able to stir with L UE for meal preparation.    Time 4    Period Weeks    Target Date 10/14/20             PT Long Term Goals - 10/05/20 1135      PT LONG TERM GOAL #1   Title The patient will be independent with progression of HEP.    Status Achieved      PT LONG TERM GOAL #2   Status On-going      PT LONG TERM GOAL #3   Title The patient will verbalize ability to put makeup on with L UE.    Status Achieved      PT LONG TERM GOAL #4   Title The patient will report dec'd sensation of pain in L trunk musculature.    Status Achieved                 Plan - 10/05/20 1137    Clinical Impression Statement Pt continues to improve coordination and strength in Lt UE. Pt with good understanding of HEP. Goals partially met. Pt plans to hold PT as she is traveling out of town for the next 2-3 weeks.    PT Next Visit Plan progress HEP as tolerated    PT Home Exercise Plan Access Code: [email or web address]    [email or web address] and Agree with Plan of Care Patient           Patient will benefit  from skilled therapeutic intervention in order to improve the following deficits and impairments:     Visit Diagnosis: Muscle weakness (generalized)  Other lack of coordination  Other symptoms and signs involving the nervous system    PHYSICAL THERAPY DISCHARGE SUMMARY  Visits from Start of Care: 3  Current functional level related to goals / functional outcomes: Improved strength and endurance and coordination   Remaining deficits: See above Education / Equipment: HEP Plan: Patient agrees to discharge.  Patient goals were partially met. Patient is being discharged due to meeting the stated rehab goals.  ?????      Problem List There are no problems to display for this patient. Isabelle Course, PT   Isabelle Course, PT,DPT03/23/221:54 PM  [place] 10/05/2020, 11:39 AM  [facility] [place], [place], [address] Phone: [phone]   Fax:  [phone]  Name: Rosemond Lyttle MRN: [id number] Date of Birth: 03/01/1956

## 2020-10-12 DIAGNOSIS — I471 Supraventricular tachycardia: Secondary | ICD-10-CM | POA: Diagnosis not present

## 2020-10-25 DIAGNOSIS — I639 Cerebral infarction, unspecified: Secondary | ICD-10-CM | POA: Diagnosis not present

## 2020-10-31 DIAGNOSIS — I10 Essential (primary) hypertension: Secondary | ICD-10-CM | POA: Diagnosis not present

## 2020-10-31 DIAGNOSIS — Z720 Tobacco use: Secondary | ICD-10-CM | POA: Diagnosis not present

## 2020-10-31 DIAGNOSIS — M25561 Pain in right knee: Secondary | ICD-10-CM | POA: Diagnosis not present

## 2020-10-31 DIAGNOSIS — E785 Hyperlipidemia, unspecified: Secondary | ICD-10-CM | POA: Diagnosis not present

## 2020-10-31 DIAGNOSIS — I639 Cerebral infarction, unspecified: Secondary | ICD-10-CM | POA: Diagnosis not present

## 2020-10-31 DIAGNOSIS — M25562 Pain in left knee: Secondary | ICD-10-CM | POA: Diagnosis not present

## 2020-12-20 DIAGNOSIS — I639 Cerebral infarction, unspecified: Secondary | ICD-10-CM | POA: Diagnosis not present

## 2021-02-06 DIAGNOSIS — Z1159 Encounter for screening for other viral diseases: Secondary | ICD-10-CM | POA: Diagnosis not present

## 2021-02-06 DIAGNOSIS — I10 Essential (primary) hypertension: Secondary | ICD-10-CM | POA: Diagnosis not present

## 2021-02-06 DIAGNOSIS — Z0000 Encounter for general adult medical examination without abnormal findings: Secondary | ICD-10-CM | POA: Diagnosis not present

## 2021-02-06 DIAGNOSIS — Z87891 Personal history of nicotine dependence: Secondary | ICD-10-CM | POA: Diagnosis not present

## 2021-02-06 DIAGNOSIS — Z1211 Encounter for screening for malignant neoplasm of colon: Secondary | ICD-10-CM | POA: Diagnosis not present

## 2021-02-06 DIAGNOSIS — M25562 Pain in left knee: Secondary | ICD-10-CM | POA: Diagnosis not present

## 2021-02-06 DIAGNOSIS — F32A Depression, unspecified: Secondary | ICD-10-CM | POA: Diagnosis not present

## 2021-02-06 DIAGNOSIS — M25561 Pain in right knee: Secondary | ICD-10-CM | POA: Diagnosis not present

## 2021-02-06 DIAGNOSIS — E2839 Other primary ovarian failure: Secondary | ICD-10-CM | POA: Diagnosis not present

## 2021-02-06 DIAGNOSIS — F419 Anxiety disorder, unspecified: Secondary | ICD-10-CM | POA: Diagnosis not present

## 2021-02-06 DIAGNOSIS — Z1231 Encounter for screening mammogram for malignant neoplasm of breast: Secondary | ICD-10-CM | POA: Diagnosis not present

## 2021-02-06 DIAGNOSIS — G8929 Other chronic pain: Secondary | ICD-10-CM | POA: Diagnosis not present

## 2021-02-06 DIAGNOSIS — I639 Cerebral infarction, unspecified: Secondary | ICD-10-CM | POA: Diagnosis not present

## 2021-03-14 DIAGNOSIS — I639 Cerebral infarction, unspecified: Secondary | ICD-10-CM | POA: Diagnosis not present

## 2021-07-04 DIAGNOSIS — H02831 Dermatochalasis of right upper eyelid: Secondary | ICD-10-CM | POA: Diagnosis not present

## 2021-07-04 DIAGNOSIS — H25813 Combined forms of age-related cataract, bilateral: Secondary | ICD-10-CM | POA: Diagnosis not present

## 2021-07-04 DIAGNOSIS — H527 Unspecified disorder of refraction: Secondary | ICD-10-CM | POA: Diagnosis not present

## 2021-07-04 DIAGNOSIS — H02834 Dermatochalasis of left upper eyelid: Secondary | ICD-10-CM | POA: Diagnosis not present

## 2021-07-04 DIAGNOSIS — H43813 Vitreous degeneration, bilateral: Secondary | ICD-10-CM | POA: Diagnosis not present

## 2021-07-04 DIAGNOSIS — H359 Unspecified retinal disorder: Secondary | ICD-10-CM | POA: Diagnosis not present
